# Patient Record
Sex: Male | Born: 1965 | Smoking: Never smoker
Health system: Southern US, Community
[De-identification: ages and names within clinical notes are randomized; demographics above are authoritative.]

## PROBLEM LIST (undated history)

## (undated) DIAGNOSIS — I10 Essential (primary) hypertension: Secondary | ICD-10-CM

## (undated) HISTORY — PX: BACK SURGERY: SHX140

---

## 2017-06-06 ENCOUNTER — Encounter (HOSPITAL_COMMUNITY): Payer: Self-pay

## 2017-06-06 ENCOUNTER — Emergency Department (HOSPITAL_COMMUNITY): Payer: Self-pay

## 2017-06-06 ENCOUNTER — Emergency Department (HOSPITAL_COMMUNITY)
Admission: EM | Admit: 2017-06-06 | Discharge: 2017-06-06 | Disposition: A | Discharge status: Home / Self Care | Payer: Self-pay | Attending: Emergency Medicine | Admitting: Emergency Medicine

## 2017-06-06 DIAGNOSIS — Y92410 Unspecified street and highway as the place of occurrence of the external cause: Secondary | ICD-10-CM | POA: Insufficient documentation

## 2017-06-06 DIAGNOSIS — Y939 Activity, unspecified: Secondary | ICD-10-CM | POA: Insufficient documentation

## 2017-06-06 DIAGNOSIS — Y999 Unspecified external cause status: Secondary | ICD-10-CM | POA: Insufficient documentation

## 2017-06-06 DIAGNOSIS — I10 Essential (primary) hypertension: Secondary | ICD-10-CM | POA: Insufficient documentation

## 2017-06-06 DIAGNOSIS — S39012A Strain of muscle, fascia and tendon of lower back, initial encounter: Secondary | ICD-10-CM | POA: Insufficient documentation

## 2017-06-06 HISTORY — DX: Essential (primary) hypertension: I10

## 2017-06-06 MED ORDER — IBUPROFEN 400 MG PO TABS
600.0000 mg | ORAL_TABLET | Freq: Once | ORAL | Status: AC
  Administered 2017-06-06: 16:00:00 600 mg via ORAL
  Filled 2017-06-06: qty 1

## 2017-06-06 NOTE — ED Notes (Signed)
Pt loud and talking over other patients in triage, this Rn redirected him to speak with me after I had assisted the patients I was working with, the pt was told that we would be happy to help him if he would remain calm and not yell, pt returned to his seat and did not wait for assistance
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
## 2017-06-06 NOTE — Discharge Instructions (Signed)
The pain your experiencing is likely due to muscle strain, you may take Ibuprofen and flexeril as needed for pain management.  The muscle soreness should improve over the next week. Follow up with your Dr. Joelene Millinliver in the next week for a recheck. Return to ED if pain is worsening, you develop weakness or numbness of extremities, or new or concerning symptoms develop.

## 2017-06-06 NOTE — ED Notes (Signed)
Pt requesting to have patient experience number, pt states, "I am going to teach you about being rude." pt declines to speak with charge RN at this time, pt pointing at me and loud and vocal around other patients, pt provided with requested information
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
## 2017-06-06 NOTE — ED Notes (Addendum)
Pt came up to the Nurse 1st station demanding the RN's name. Pt very agitated and speaking to the RN condescendingly. Pt asking for the pt experience number. Pt was asked by the Nurse 1st RN for the his name. Pt initially refused, but eventually gave name. Pt informed that he had a room. Pt stated that he believed the Tech, but did not believe the RN. Pt also stated that he still wanted the RN's name and would be reporting her.
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
